# Patient Record
Sex: Male | Born: 1990 | Race: Black or African American | Hispanic: No | Marital: Single | Smoking: Never smoker
Health system: Southern US, Community
[De-identification: ages and names within clinical notes are randomized; demographics above are authoritative.]

## PROBLEM LIST (undated history)

## (undated) HISTORY — PX: MOUTH SURGERY: SHX715

## (undated) HISTORY — PX: OTHER SURGICAL HISTORY: SHX169

---
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
## 2015-12-15 ENCOUNTER — Emergency Department (HOSPITAL_COMMUNITY)
Admission: EM | Admit: 2015-12-15 | Discharge: 2015-12-15 | Disposition: A | Discharge status: Home / Self Care | Payer: No Typology Code available for payment source | Attending: Emergency Medicine | Admitting: Emergency Medicine

## 2015-12-15 ENCOUNTER — Encounter (HOSPITAL_COMMUNITY): Payer: Self-pay

## 2015-12-15 DIAGNOSIS — S161XXA Strain of muscle, fascia and tendon at neck level, initial encounter: Secondary | ICD-10-CM | POA: Diagnosis not present

## 2015-12-15 DIAGNOSIS — Y999 Unspecified external cause status: Secondary | ICD-10-CM | POA: Diagnosis not present

## 2015-12-15 DIAGNOSIS — S63642A Sprain of metacarpophalangeal joint of left thumb, initial encounter: Secondary | ICD-10-CM | POA: Insufficient documentation

## 2015-12-15 DIAGNOSIS — S169XXA Unspecified injury of muscle, fascia and tendon at neck level, initial encounter: Secondary | ICD-10-CM | POA: Diagnosis present

## 2015-12-15 DIAGNOSIS — Y92410 Unspecified street and highway as the place of occurrence of the external cause: Secondary | ICD-10-CM | POA: Diagnosis not present

## 2015-12-15 DIAGNOSIS — Y939 Activity, unspecified: Secondary | ICD-10-CM | POA: Diagnosis not present

## 2015-12-15 MED ORDER — METHOCARBAMOL 500 MG PO TABS: 1000.0000 mg | ORAL_TABLET | Freq: Four times a day (QID) | ORAL | 0 refills | Status: AC
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
## 2015-12-15 NOTE — Progress Notes (Signed)
Orthopedic Tech Progress Note Patient Details:  Daine FlorasDemonte D Krichbaum 1991/01/05 [id number]  Ortho Devices Type of Ortho Device: Thumb spica splint Ortho Device/Splint Interventions: Application   Saul FordyceJennifer C Senita Corredor 12/15/2015, 5:03 PM
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
